# Patient Record
Sex: Female | Born: 1945 | Race: White | Hispanic: No | Marital: Married | Smoking: Former smoker
Health system: Southern US, Community
[De-identification: ages and names within clinical notes are randomized; demographics above are authoritative.]

## PROBLEM LIST (undated history)

## (undated) DIAGNOSIS — E785 Hyperlipidemia, unspecified: Secondary | ICD-10-CM

## (undated) DIAGNOSIS — I10 Essential (primary) hypertension: Secondary | ICD-10-CM

## (undated) DIAGNOSIS — C801 Malignant (primary) neoplasm, unspecified: Secondary | ICD-10-CM

## (undated) DIAGNOSIS — I447 Left bundle-branch block, unspecified: Secondary | ICD-10-CM

## (undated) HISTORY — PX: TUBAL LIGATION: SHX77

---

## 2004-12-22 ENCOUNTER — Ambulatory Visit: Payer: Self-pay | Admitting: Internal Medicine

## 2007-01-27 ENCOUNTER — Ambulatory Visit: Payer: Self-pay | Admitting: Nurse Practitioner

## 2008-02-09 ENCOUNTER — Ambulatory Visit: Payer: Self-pay | Admitting: Nurse Practitioner

## 2008-03-05 ENCOUNTER — Ambulatory Visit: Payer: Self-pay | Admitting: Gastroenterology

## 2009-02-14 ENCOUNTER — Ambulatory Visit: Payer: Self-pay | Admitting: Nurse Practitioner

## 2010-03-03 ENCOUNTER — Ambulatory Visit: Payer: Self-pay | Admitting: Nurse Practitioner

## 2011-06-25 ENCOUNTER — Ambulatory Visit: Payer: Self-pay | Admitting: Family Medicine

## 2012-09-29 ENCOUNTER — Ambulatory Visit: Payer: Self-pay | Admitting: Family Medicine

## 2013-10-03 ENCOUNTER — Ambulatory Visit: Payer: Self-pay | Admitting: Family Medicine

## 2014-11-08 ENCOUNTER — Ambulatory Visit: Payer: Self-pay | Admitting: Family Medicine

## 2015-09-10 ENCOUNTER — Other Ambulatory Visit: Payer: Self-pay | Admitting: Obstetrics and Gynecology

## 2015-09-10 DIAGNOSIS — Z1231 Encounter for screening mammogram for malignant neoplasm of breast: Secondary | ICD-10-CM

## 2015-11-12 ENCOUNTER — Ambulatory Visit
Admission: RE | Admit: 2015-11-12 | Discharge: 2015-11-12 | Disposition: A | Discharge status: Home / Self Care | Payer: Medicare Other | Source: Ambulatory Visit | Attending: Obstetrics and Gynecology | Admitting: Obstetrics and Gynecology

## 2015-11-12 DIAGNOSIS — Z1231 Encounter for screening mammogram for malignant neoplasm of breast: Secondary | ICD-10-CM | POA: Insufficient documentation

## 2015-11-12 HISTORY — DX: Malignant (primary) neoplasm, unspecified: C80.1

## 2016-12-23 ENCOUNTER — Other Ambulatory Visit: Payer: Self-pay | Admitting: Family Medicine

## 2016-12-23 DIAGNOSIS — Z1231 Encounter for screening mammogram for malignant neoplasm of breast: Secondary | ICD-10-CM

## 2016-12-28 ENCOUNTER — Ambulatory Visit
Admission: RE | Admit: 2016-12-28 | Discharge: 2016-12-28 | Disposition: A | Discharge status: Home / Self Care | Payer: Medicare Other | Source: Ambulatory Visit | Attending: Family Medicine | Admitting: Family Medicine

## 2016-12-28 DIAGNOSIS — Z1231 Encounter for screening mammogram for malignant neoplasm of breast: Secondary | ICD-10-CM | POA: Diagnosis present

## 2017-11-24 ENCOUNTER — Other Ambulatory Visit: Payer: Self-pay | Admitting: Family Medicine

## 2017-11-24 DIAGNOSIS — Z1239 Encounter for other screening for malignant neoplasm of breast: Secondary | ICD-10-CM

## 2017-12-29 ENCOUNTER — Ambulatory Visit
Admission: RE | Admit: 2017-12-29 | Discharge: 2017-12-29 | Disposition: A | Discharge status: Home / Self Care | Payer: Medicare Other | Source: Ambulatory Visit | Attending: Family Medicine | Admitting: Family Medicine

## 2017-12-29 DIAGNOSIS — Z1231 Encounter for screening mammogram for malignant neoplasm of breast: Secondary | ICD-10-CM | POA: Insufficient documentation

## 2017-12-29 DIAGNOSIS — Z1239 Encounter for other screening for malignant neoplasm of breast: Secondary | ICD-10-CM

## 2019-04-10 ENCOUNTER — Other Ambulatory Visit: Payer: Self-pay | Admitting: Family Medicine

## 2019-04-10 DIAGNOSIS — Z1231 Encounter for screening mammogram for malignant neoplasm of breast: Secondary | ICD-10-CM

## 2019-04-13 ENCOUNTER — Other Ambulatory Visit: Payer: Self-pay

## 2019-04-13 ENCOUNTER — Ambulatory Visit
Admission: RE | Admit: 2019-04-13 | Discharge: 2019-04-13 | Disposition: A | Discharge status: Home / Self Care | Payer: Medicare HMO | Source: Ambulatory Visit | Attending: Family Medicine | Admitting: Family Medicine

## 2019-04-13 DIAGNOSIS — Z1231 Encounter for screening mammogram for malignant neoplasm of breast: Secondary | ICD-10-CM | POA: Insufficient documentation

## 2020-04-08 ENCOUNTER — Other Ambulatory Visit: Payer: Self-pay | Admitting: Family Medicine

## 2020-04-08 DIAGNOSIS — Z1231 Encounter for screening mammogram for malignant neoplasm of breast: Secondary | ICD-10-CM

## 2020-04-18 ENCOUNTER — Ambulatory Visit
Admission: RE | Admit: 2020-04-18 | Discharge: 2020-04-18 | Disposition: A | Discharge status: Home / Self Care | Payer: Medicare HMO | Source: Ambulatory Visit | Attending: Family Medicine | Admitting: Family Medicine

## 2020-04-18 ENCOUNTER — Other Ambulatory Visit: Payer: Self-pay

## 2020-04-18 DIAGNOSIS — Z1231 Encounter for screening mammogram for malignant neoplasm of breast: Secondary | ICD-10-CM | POA: Diagnosis present

## 2020-08-01 ENCOUNTER — Other Ambulatory Visit: Payer: Medicare HMO

## 2020-09-12 ENCOUNTER — Other Ambulatory Visit: Payer: Medicare HMO

## 2020-09-12 ENCOUNTER — Other Ambulatory Visit: Payer: Self-pay

## 2020-09-12 ENCOUNTER — Other Ambulatory Visit
Admission: RE | Admit: 2020-09-12 | Discharge: 2020-09-12 | Disposition: A | Discharge status: Home / Self Care | Payer: Medicare HMO | Source: Ambulatory Visit | Attending: Gastroenterology | Admitting: Gastroenterology

## 2020-09-12 ENCOUNTER — Encounter: Payer: Self-pay | Admitting: *Deleted

## 2020-09-12 DIAGNOSIS — Z01812 Encounter for preprocedural laboratory examination: Secondary | ICD-10-CM | POA: Diagnosis present

## 2020-09-12 DIAGNOSIS — Z20822 Contact with and (suspected) exposure to covid-19: Secondary | ICD-10-CM | POA: Insufficient documentation

## 2020-09-12 LAB — SARS CORONAVIRUS 2 (TAT 6-24 HRS): SARS Coronavirus 2: NEGATIVE

## 2020-09-16 ENCOUNTER — Ambulatory Visit
Admission: RE | Admit: 2020-09-16 | Discharge: 2020-09-16 | Disposition: A | Discharge status: Home / Self Care | Payer: Medicare HMO | Attending: Gastroenterology | Admitting: Gastroenterology

## 2020-09-16 ENCOUNTER — Other Ambulatory Visit: Payer: Self-pay

## 2020-09-16 ENCOUNTER — Ambulatory Visit: Payer: Medicare HMO | Admitting: Anesthesiology

## 2020-09-16 ENCOUNTER — Encounter: Payer: Self-pay | Admitting: *Deleted

## 2020-09-16 ENCOUNTER — Encounter
Admission: RE | Disposition: A | Discharge status: Home / Self Care | Payer: Self-pay | Source: Home / Self Care | Attending: Gastroenterology

## 2020-09-16 DIAGNOSIS — K5730 Diverticulosis of large intestine without perforation or abscess without bleeding: Secondary | ICD-10-CM | POA: Insufficient documentation

## 2020-09-16 DIAGNOSIS — Z1211 Encounter for screening for malignant neoplasm of colon: Secondary | ICD-10-CM | POA: Insufficient documentation

## 2020-09-16 DIAGNOSIS — I10 Essential (primary) hypertension: Secondary | ICD-10-CM | POA: Insufficient documentation

## 2020-09-16 DIAGNOSIS — Z79899 Other long term (current) drug therapy: Secondary | ICD-10-CM | POA: Insufficient documentation

## 2020-09-16 DIAGNOSIS — Z888 Allergy status to other drugs, medicaments and biological substances status: Secondary | ICD-10-CM | POA: Insufficient documentation

## 2020-09-16 DIAGNOSIS — Z7982 Long term (current) use of aspirin: Secondary | ICD-10-CM | POA: Diagnosis not present

## 2020-09-16 DIAGNOSIS — Z800 Family history of malignant neoplasm of digestive organs: Secondary | ICD-10-CM | POA: Diagnosis not present

## 2020-09-16 HISTORY — DX: Left bundle-branch block, unspecified: I44.7

## 2020-09-16 HISTORY — DX: Hyperlipidemia, unspecified: E78.5

## 2020-09-16 HISTORY — DX: Essential (primary) hypertension: I10

## 2020-09-16 HISTORY — PX: COLONOSCOPY: SHX5424

## 2020-09-16 SURGERY — COLONOSCOPY
Anesthesia: General

## 2020-09-16 MED ORDER — EPHEDRINE SULFATE 50 MG/ML IJ SOLN
INTRAMUSCULAR | Status: DC | PRN
  Administered 2020-09-16: 5 mg via INTRAVENOUS

## 2020-09-16 MED ORDER — PROPOFOL 500 MG/50ML IV EMUL
INTRAVENOUS | Status: AC
  Filled 2020-09-16: qty 50

## 2020-09-16 MED ORDER — LIDOCAINE HCL (PF) 2 % IJ SOLN
INTRAMUSCULAR | Status: AC
  Filled 2020-09-16: qty 5

## 2020-09-16 MED ORDER — PROPOFOL 500 MG/50ML IV EMUL
INTRAVENOUS | Status: DC | PRN
  Administered 2020-09-16: 150 ug/kg/min via INTRAVENOUS

## 2020-09-16 MED ORDER — PROPOFOL 10 MG/ML IV BOLUS
INTRAVENOUS | Status: DC | PRN
  Administered 2020-09-16: 60 mg via INTRAVENOUS

## 2020-09-16 MED ORDER — SODIUM CHLORIDE 0.9 % IV SOLN: INTRAVENOUS | Status: DC

## 2020-09-16 MED ORDER — LIDOCAINE HCL (CARDIAC) PF 100 MG/5ML IV SOSY
PREFILLED_SYRINGE | INTRAVENOUS | Status: DC | PRN
  Administered 2020-09-16: 50 mg via INTRAVENOUS

## 2020-09-16 MED ORDER — EPHEDRINE 5 MG/ML INJ
INTRAVENOUS | Status: AC
  Filled 2020-09-16: qty 10

## 2020-09-16 NOTE — H&P (Signed)
Outpatient short stay form Pre-procedure 09/16/2020 7:55 AM Valerie Lot MD, MPH  Primary Physician: Dr. Maryjane Hurter  Reason for visit:  Screening  History of present illness:   75 y/o lady with history of colon cancer in her grandmother here for screening colonoscopy. Two prior colonoscopies were normal. No blood thinners. No significant abdominal surgeries. No new GI symptoms.    Current Facility-Administered Medications:  .  0.9 %  sodium chloride infusion, , Intravenous, Continuous, Myca Perno, Rossie Muskrat, MD, Last Rate: 20 mL/hr at 09/16/20 0734, New Bag at 09/16/20 0734  Medications Prior to Admission  Medication Sig Dispense Refill Last Dose  . aspirin EC 81 MG tablet Take 81 mg by mouth daily. Swallow whole.   09/15/2020 at Unknown time  . carvedilol (COREG) 6.25 MG tablet Take 6.25 mg by mouth 2 (two) times daily with a meal.   09/16/2020 at 0600  . lisinopril (ZESTRIL) 20 MG tablet Take 20 mg by mouth daily.   09/15/2020 at Unknown time  . naproxen sodium (ALEVE) 220 MG tablet Take 220 mg by mouth 2 (two) times daily as needed.   Past Week at Unknown time  . MELATONIN PO Take by mouth daily.   09/14/2020  . Omega-3 Fatty Acids (OMEGA-3 EPA FISH OIL PO) Take by mouth daily.   09/13/2020     Allergies  Allergen Reactions  . Zocor [Simvastatin]     Muscle pain      Past Medical History:  Diagnosis Date  . Cancer (HCC)    skin ca / basal cell   . Hyperlipidemia   . Hypertension   . Left bundle branch block     Review of systems:  Otherwise negative.    Physical Exam  Gen: Alert, oriented. Appears stated age.  HEENT: PERRLA. Lungs: No respiratory distress CV: RRR Abd: soft, benign, no masses Ext: No edema    Planned procedures: Proceed with colonoscopy. The patient understands the nature of the planned procedure, indications, risks, alternatives and potential complications including but not limited to bleeding, infection, perforation, damage to internal organs and  possible oversedation/side effects from anesthesia. The patient agrees and gives consent to proceed.  Please refer to procedure notes for findings, recommendations and patient disposition/instructions.     Valerie Lot MD, MPH Gastroenterology 09/16/2020  7:55 AM

## 2020-09-16 NOTE — Anesthesia Postprocedure Evaluation (Signed)
Anesthesia Post Note  Patient: Valerie Richmond  Procedure(s) Performed: COLONOSCOPY (N/A )  Patient location during evaluation: Endoscopy Anesthesia Type: General Level of consciousness: awake and alert Pain management: pain level controlled Vital Signs Assessment: post-procedure vital signs reviewed and stable Respiratory status: spontaneous breathing, nonlabored ventilation, respiratory function stable and patient connected to nasal cannula oxygen Cardiovascular status: blood pressure returned to baseline and stable Postop Assessment: no apparent nausea or vomiting Anesthetic complications: no   No complications documented.   Last Vitals:  Vitals:   09/16/20 0850 09/16/20 0900  BP: (!) 118/59 (!) 104/53  Pulse: 62 71  Resp: 20 (!) 21  Temp:    SpO2: 100% 100%    Last Pain:  Vitals:   09/16/20 0820  TempSrc: Temporal  PainSc:                  Corinda Gubler

## 2020-09-16 NOTE — Anesthesia Preprocedure Evaluation (Signed)
Anesthesia Evaluation  Patient identified by MRN, date of birth, ID band Patient awake    Reviewed: Allergy & Precautions, NPO status , Patient's Chart, lab work & pertinent test results  History of Anesthesia Complications Negative for: history of anesthetic complications  Airway Mallampati: II  TM Distance: >3 FB Neck ROM: Full    Dental no notable dental hx. (+) Teeth Intact   Pulmonary neg pulmonary ROS, neg sleep apnea, neg COPD, Patient abstained from smoking.Not current smoker, former smoker,    Pulmonary exam normal breath sounds clear to auscultation       Cardiovascular Exercise Tolerance: Good METShypertension, (-) CAD and (-) Past MI + dysrhythmias  Rhythm:Regular Rate:Normal - Systolic murmurs LBBB   Neuro/Psych negative neurological ROS  negative psych ROS   GI/Hepatic neg GERD  ,(+)     (-) substance abuse  ,   Endo/Other  neg diabetes  Renal/GU negative Renal ROS     Musculoskeletal   Abdominal   Peds  Hematology   Anesthesia Other Findings Past Medical History: No date: Cancer (HCC)     Comment:  skin ca / basal cell  No date: Hyperlipidemia No date: Hypertension No date: Left bundle branch block  Reproductive/Obstetrics                             Anesthesia Physical Anesthesia Plan  ASA: II  Anesthesia Plan: General   Post-op Pain Management:    Induction: Intravenous  PONV Risk Score and Plan: 3 and Ondansetron, Propofol infusion and TIVA  Airway Management Planned: Nasal Cannula  Additional Equipment: None  Intra-op Plan:   Post-operative Plan:   Informed Consent: I have reviewed the patients History and Physical, chart, labs and discussed the procedure including the risks, benefits and alternatives for the proposed anesthesia with the patient or authorized representative who has indicated his/her understanding and acceptance.     Dental  advisory given  Plan Discussed with: CRNA and Surgeon  Anesthesia Plan Comments: (Discussed risks of anesthesia with patient, including possibility of difficulty with spontaneous ventilation under anesthesia necessitating airway intervention, PONV, and rare risks such as cardiac or respiratory or neurological events. Patient understands.)        Anesthesia Quick Evaluation

## 2020-09-16 NOTE — Op Note (Signed)
[facility] Gastroenterology Patient Name: Valerie Richmond Procedure Date: 09/16/2020 7:46 AM MRN: [id number] Account #: [id number] Date of Birth: 06/17/1946 Admit Type: Outpatient Age: 75 Room: [facility] ENDO ROOM 3 Gender: Female Note Status: Finalized Procedure:             Colonoscopy Indications:           Screening for colorectal malignant neoplasm Providers:             Andrey Farmer MD, MD Referring MD:          Sofie Hartigan (Referring MD) Medicines:             Monitored Anesthesia Care Complications:         No immediate complications. Procedure:             Pre-Anesthesia Assessment:                        - Prior to the procedure, a History and Physical was                         performed, and patient medications and allergies were                         reviewed. The patient is competent. The risks and                         benefits of the procedure and the sedation options and                         risks were discussed with the patient. All questions                         were answered and informed consent was obtained.                         Patient identification and proposed procedure were                         verified by the physician, the nurse, the anesthetist                         and the technician in the endoscopy suite. Mental                         Status Examination: alert and oriented. Airway                         Examination: normal oropharyngeal airway and neck                         mobility. Respiratory Examination: clear to                         auscultation. CV Examination: normal. Prophylactic                         Antibiotics: The patient does not require prophylactic  antibiotics. Prior Anticoagulants: The patient has                         taken no previous anticoagulant or antiplatelet agents                         except for aspirin. ASA Grade Assessment: II - A                          patient with mild systemic disease. After reviewing                         the risks and benefits, the patient was deemed in                         satisfactory condition to undergo the procedure. The                         anesthesia plan was to use monitored anesthesia care                         (MAC). Immediately prior to administration of                         medications, the patient was re-assessed for adequacy                         to receive sedatives. The heart rate, respiratory                         rate, oxygen saturations, blood pressure, adequacy of                         pulmonary ventilation, and response to care were                         monitored throughout the procedure. The physical                         status of the patient was re-assessed after the                         procedure.                        After obtaining informed consent, the colonoscope was                         passed under direct vision. Throughout the procedure,                         the patient's blood pressure, pulse, and oxygen                         saturations were monitored continuously. The                         Colonoscope was introduced through the anus and  advanced to the the cecum, identified by appendiceal                         orifice and ileocecal valve. The colonoscopy was                         performed without difficulty. The patient tolerated                         the procedure well. The quality of the bowel                         preparation was good. Findings:      The perianal and digital rectal examinations were normal.      A few small-mouthed diverticula were found in the sigmoid colon.      The exam was otherwise without abnormality on direct and retroflexion       views. Impression:            - Diverticulosis in the sigmoid colon.                        - The examination was otherwise normal on direct and                          retroflexion views.                        - No specimens collected. Recommendation:        - Discharge patient to home.                        - Resume previous diet.                        - Continue present medications.                        - Repeat colonoscopy in 10 years for screening                         purposes.                        - Return to referring physician as previously                         scheduled. Procedure Code(s):     --- Professional ---                        L9767, Colorectal cancer screening; colonoscopy on                         individual not meeting criteria for high risk Diagnosis Code(s):     --- Professional ---                        Z12.11, Encounter for screening for malignant neoplasm                         of colon  K57.30, Diverticulosis of large intestine without                         perforation or abscess without bleeding CPT copyright 2019 [employer]. All rights reserved. The codes documented in this report are preliminary and upon coder review may  be revised to meet current compliance requirements. Andrey Farmer, MD Andrey Farmer MD, MD 09/16/2020 8:31:17 AM Number of Addenda: 0 Note Initiated On: 09/16/2020 7:46 AM Scope Withdrawal Time: 0 hours 5 minutes 12 seconds  Total Procedure Duration: 0 hours 9 minutes 40 seconds  Estimated Blood Loss:  Estimated blood loss: none.      [facility]

## 2020-09-16 NOTE — Transfer of Care (Signed)
Immediate Anesthesia Transfer of Care Note  Patient: Valerie Richmond  Procedure(s) Performed: COLONOSCOPY (N/A )  Patient Location: PACU  Anesthesia Type:General  Level of Consciousness: awake, alert  and oriented  Airway & Oxygen Therapy: Patient Spontanous Breathing  Post-op Assessment: Report given to RN and Post -op Vital signs reviewed and stable  Post vital signs: Reviewed and stable  Last Vitals:  Vitals Value Taken Time  BP 97/49 09/16/20 0828  Temp    Pulse 59 09/16/20 0829  Resp 18 09/16/20 0829  SpO2 98 % 09/16/20 0829  Vitals shown include unvalidated device data.  Last Pain:  Vitals:   09/16/20 0718  TempSrc: Temporal  PainSc: 0-No pain         Complications: No complications documented.

## 2020-09-16 NOTE — Interval H&P Note (Signed)
History and Physical Interval Note:  09/16/2020 7:57 AM  Valerie Richmond  has presented today for surgery, with the diagnosis of colon cancer screening.  The various methods of treatment have been discussed with the patient and family. After consideration of risks, benefits and other options for treatment, the patient has consented to  Procedure(s): COLONOSCOPY (N/A) as a surgical intervention.  The patient's history has been reviewed, patient examined, no change in status, stable for surgery.  I have reviewed the patient's chart and labs.  Questions were answered to the patient's satisfaction.     Regis Bill  Ok to proceed with colonoscopy

## 2020-09-18 ENCOUNTER — Encounter: Payer: Self-pay | Admitting: Gastroenterology

## 2021-05-29 ENCOUNTER — Other Ambulatory Visit: Payer: Self-pay | Admitting: Family Medicine

## 2021-05-29 DIAGNOSIS — Z1231 Encounter for screening mammogram for malignant neoplasm of breast: Secondary | ICD-10-CM

## 2021-06-17 ENCOUNTER — Ambulatory Visit
Admission: RE | Admit: 2021-06-17 | Discharge: 2021-06-17 | Disposition: A | Discharge status: Home / Self Care | Payer: Medicare HMO | Source: Ambulatory Visit | Attending: Family Medicine | Admitting: Family Medicine

## 2021-06-17 ENCOUNTER — Other Ambulatory Visit: Payer: Self-pay

## 2021-06-17 DIAGNOSIS — Z1231 Encounter for screening mammogram for malignant neoplasm of breast: Secondary | ICD-10-CM | POA: Diagnosis not present

## 2022-08-09 IMAGING — MG MM DIGITAL SCREENING BILAT W/ TOMO AND CAD
6 of 10 series · 6 of 30 positions shown · non-contrast
Comparison: Previous exam(s).

CLINICAL DATA: Screening.

EXAM:
DIGITAL SCREENING BILATERAL MAMMOGRAM WITH TOMOSYNTHESIS AND CAD
TECHNIQUE: Bilateral screening digital craniocaudal and mediolateral oblique
mammograms were obtained. Bilateral screening digital breast
tomosynthesis was performed. The images were evaluated with
computer-aided detection.

[R CC synth-2D]
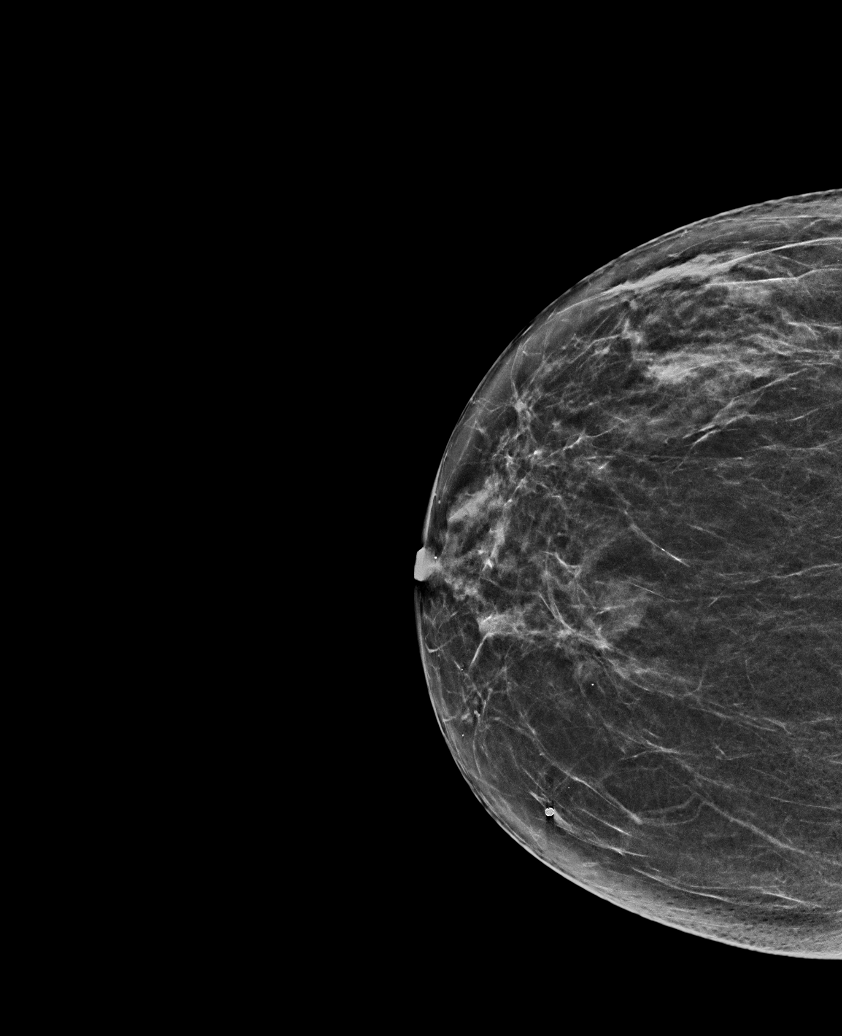

[L MLO synth-2D]
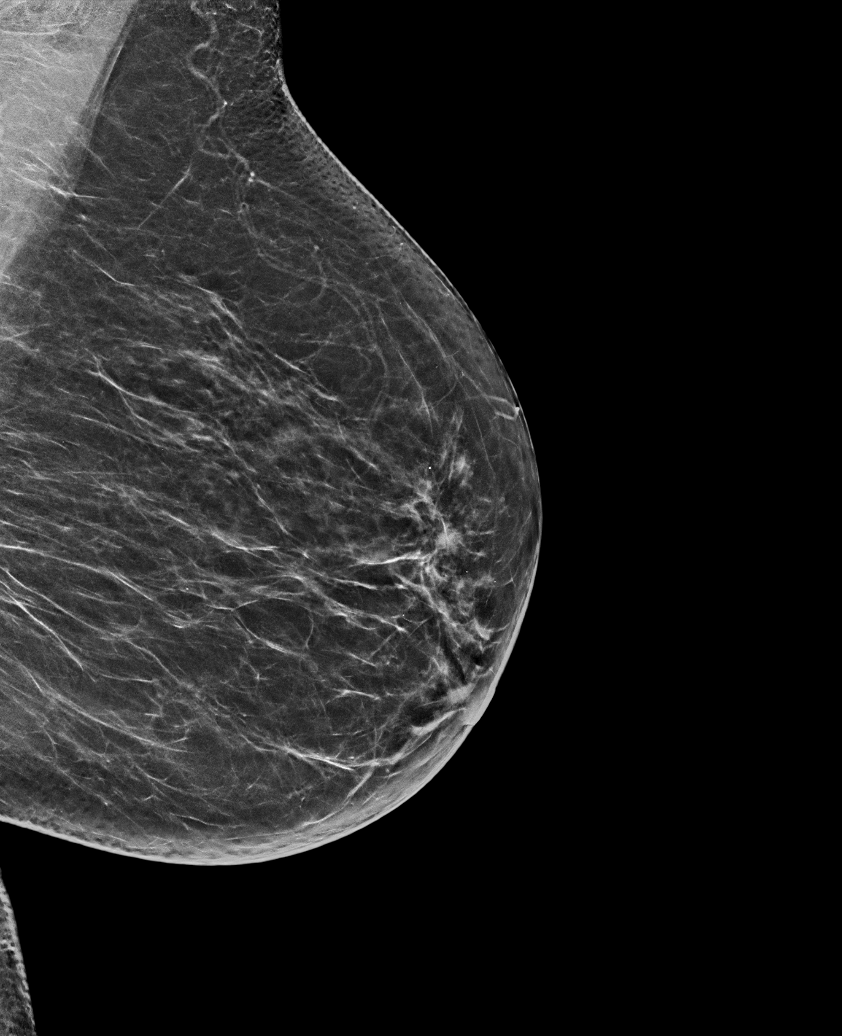

[R MLO synth-2D (1 of 2)]
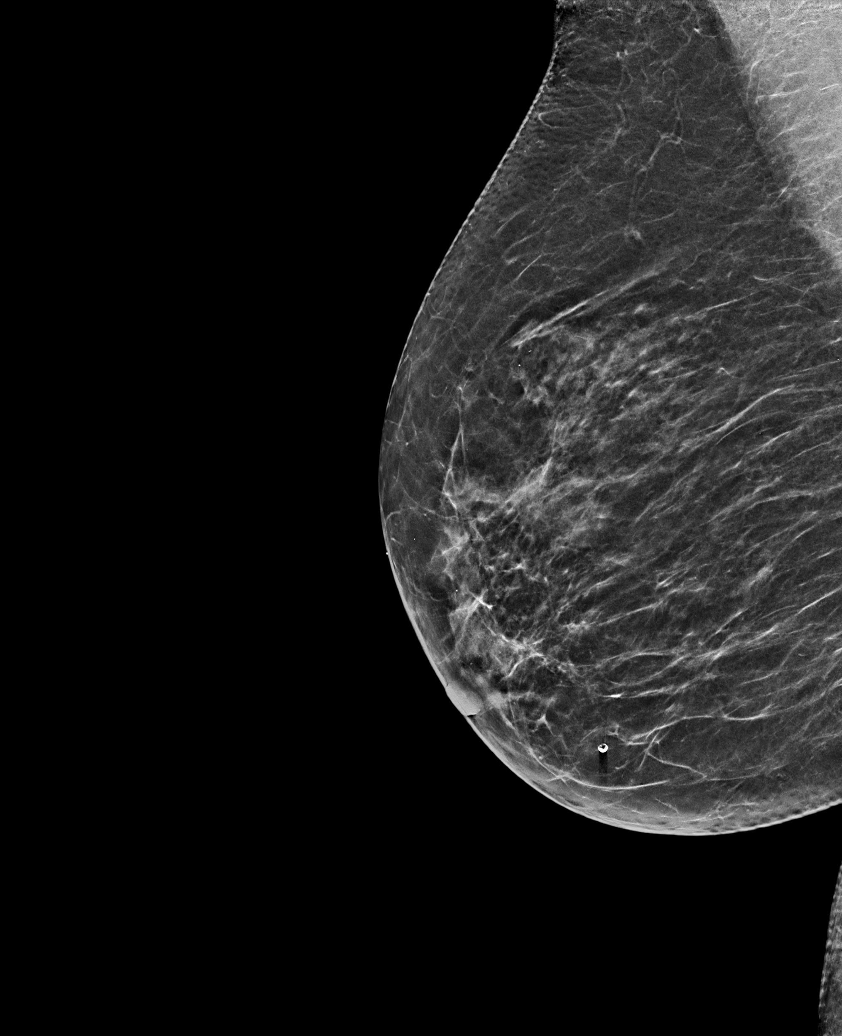

[R MLO synth-2D (2 of 2)]
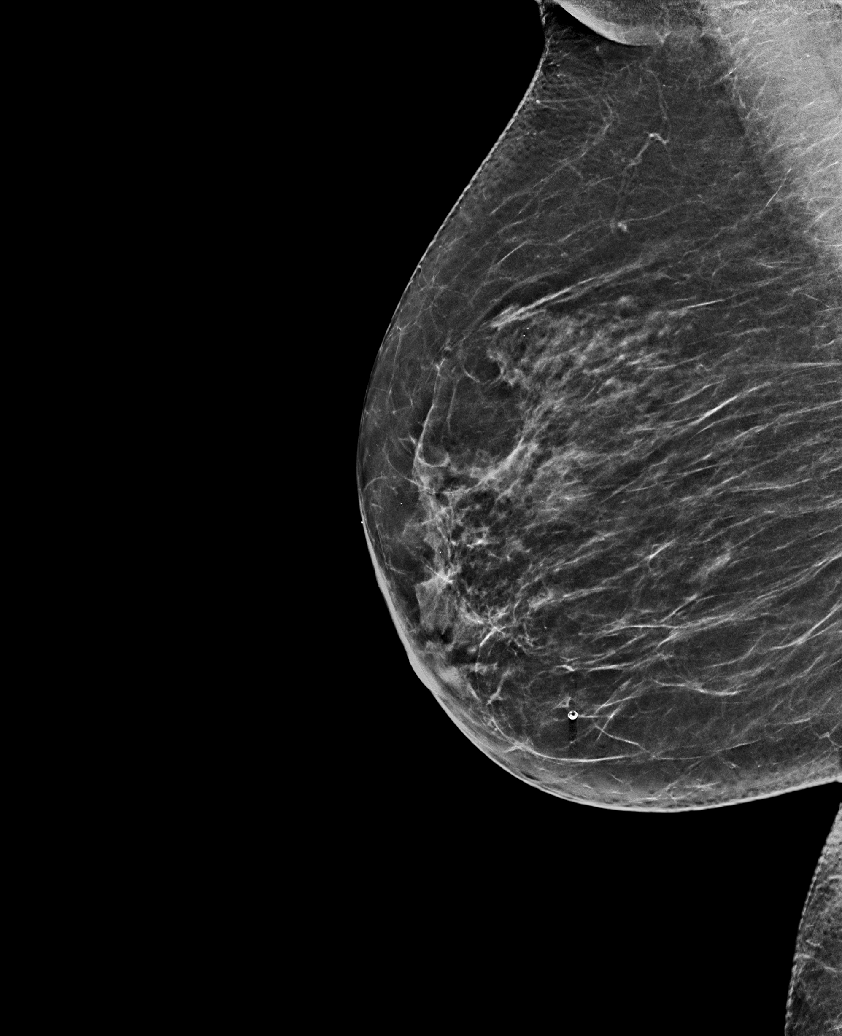

[L CC synth-2D]
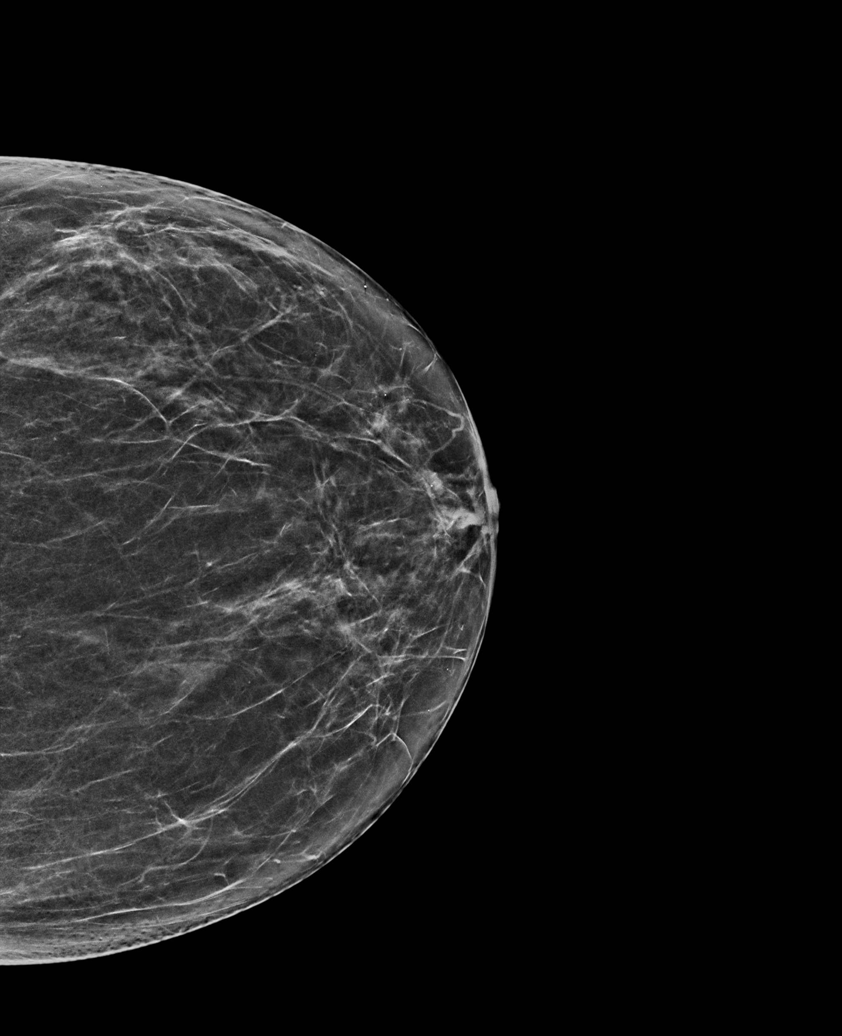

[R MLO tomo · tomo slice 33/65.0]
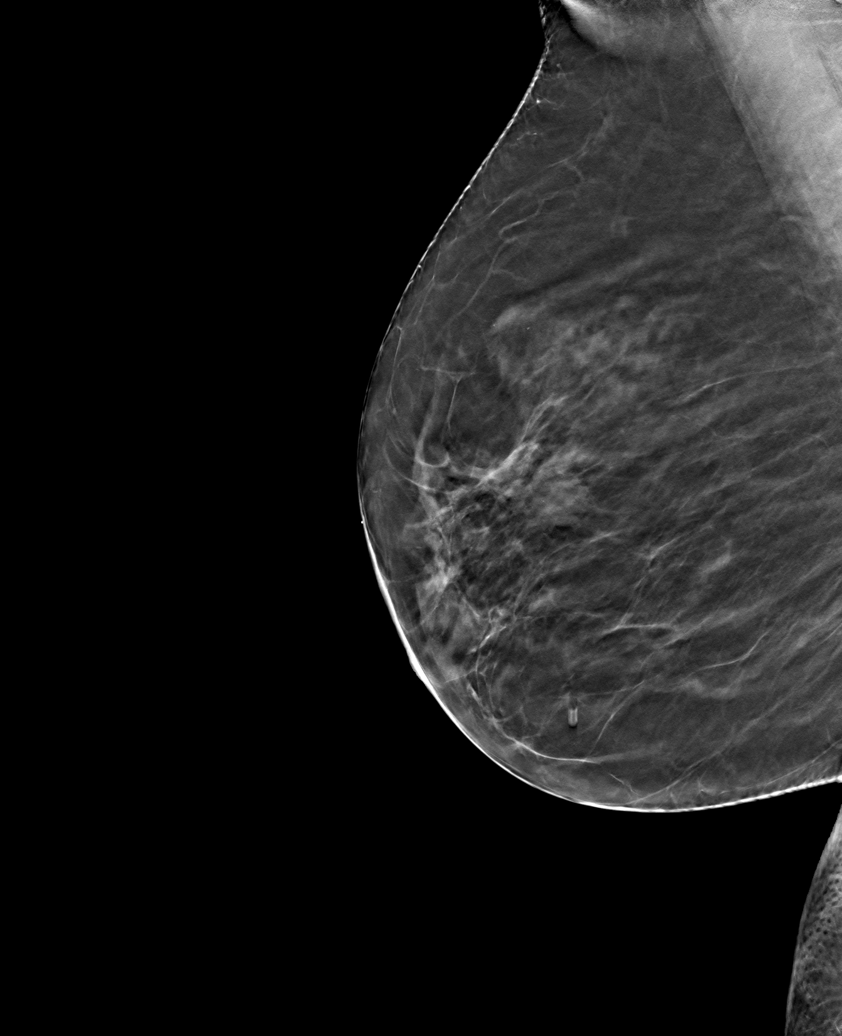

[6 of 30 positions shown; findings below may reference images not displayed]

ACR Breast Density Category b: There are scattered areas of
fibroglandular density.
FINDINGS: There are no findings suspicious for malignancy.
IMPRESSION: No mammographic evidence of malignancy. A result letter of this
screening mammogram will be mailed directly to the patient.

RECOMMENDATION:
Screening mammogram in one year. (Code:51-O-LD2)

BI-RADS CATEGORY  1: Negative.

## 2022-12-21 ENCOUNTER — Other Ambulatory Visit: Payer: Self-pay | Admitting: Family Medicine

## 2022-12-21 DIAGNOSIS — Z1231 Encounter for screening mammogram for malignant neoplasm of breast: Secondary | ICD-10-CM

## 2022-12-29 ENCOUNTER — Ambulatory Visit
Admission: RE | Admit: 2022-12-29 | Discharge: 2022-12-29 | Disposition: A | Discharge status: Home / Self Care | Payer: Medicare HMO | Source: Ambulatory Visit | Attending: Family Medicine | Admitting: Family Medicine

## 2022-12-29 DIAGNOSIS — Z1231 Encounter for screening mammogram for malignant neoplasm of breast: Secondary | ICD-10-CM | POA: Diagnosis present

## 2024-07-21 ENCOUNTER — Other Ambulatory Visit: Payer: Self-pay | Admitting: Family Medicine

## 2024-07-21 DIAGNOSIS — Z1231 Encounter for screening mammogram for malignant neoplasm of breast: Secondary | ICD-10-CM

## 2024-08-24 ENCOUNTER — Ambulatory Visit

## 2024-09-26 ENCOUNTER — Ambulatory Visit

## 2024-10-31 ENCOUNTER — Ambulatory Visit
